# Patient Record
Sex: Female | Born: 1980 | Marital: Single | Smoking: Never smoker
Health system: Southern US, Community
[De-identification: ages and names within clinical notes are randomized; demographics above are authoritative.]

## PROBLEM LIST (undated history)

## (undated) HISTORY — PX: CHOLECYSTECTOMY: SHX55

---

## 2006-04-08 ENCOUNTER — Ambulatory Visit: Payer: Self-pay | Admitting: Family Medicine

## 2006-04-08 ENCOUNTER — Ambulatory Visit: Payer: Self-pay | Admitting: *Deleted

## 2007-07-27 DIAGNOSIS — B353 Tinea pedis: Secondary | ICD-10-CM

## 2007-07-27 DIAGNOSIS — F17200 Nicotine dependence, unspecified, uncomplicated: Secondary | ICD-10-CM

## 2007-07-27 DIAGNOSIS — H01009 Unspecified blepharitis unspecified eye, unspecified eyelid: Secondary | ICD-10-CM | POA: Insufficient documentation

## 2019-03-25 ENCOUNTER — Encounter (HOSPITAL_BASED_OUTPATIENT_CLINIC_OR_DEPARTMENT_OTHER): Payer: Self-pay | Admitting: Emergency Medicine

## 2019-03-25 ENCOUNTER — Emergency Department (HOSPITAL_BASED_OUTPATIENT_CLINIC_OR_DEPARTMENT_OTHER): Payer: Medicaid Other

## 2019-03-25 ENCOUNTER — Emergency Department (HOSPITAL_BASED_OUTPATIENT_CLINIC_OR_DEPARTMENT_OTHER)
Admission: EM | Admit: 2019-03-25 | Discharge: 2019-03-25 | Disposition: A | Discharge status: Home / Self Care | Payer: Medicaid Other | Attending: Emergency Medicine | Admitting: Emergency Medicine

## 2019-03-25 ENCOUNTER — Other Ambulatory Visit: Payer: Self-pay

## 2019-03-25 DIAGNOSIS — M545 Low back pain, unspecified: Secondary | ICD-10-CM

## 2019-03-25 LAB — URINALYSIS, ROUTINE W REFLEX MICROSCOPIC
Bilirubin Urine: NEGATIVE
Glucose, UA: NEGATIVE mg/dL
Hgb urine dipstick: NEGATIVE
Ketones, ur: NEGATIVE mg/dL
Nitrite: NEGATIVE
Protein, ur: NEGATIVE mg/dL
Specific Gravity, Urine: >1.03 — ABNORMAL HIGH (ref 1.005–1.030)
pH: 6 (ref 5.0–8.0)

## 2019-03-25 LAB — URINALYSIS, MICROSCOPIC (REFLEX): RBC / HPF: NONE SEEN RBC/hpf (ref 0–5)

## 2019-03-25 LAB — PREGNANCY, URINE: Preg Test, Ur: NEGATIVE

## 2019-03-25 MED ORDER — KETOROLAC TROMETHAMINE 30 MG/ML IJ SOLN
30.0000 mg | Freq: Once | INTRAMUSCULAR | Status: AC
  Administered 2019-03-25: 30 mg via INTRAMUSCULAR
  Filled 2019-03-25: qty 1

## 2019-03-25 MED ORDER — METHOCARBAMOL 750 MG PO TABS: 750.0000 mg | ORAL_TABLET | Freq: Two times a day (BID) | ORAL | 0 refills | Status: AC

## 2019-03-25 MED ORDER — LIDOCAINE 5 % EX PTCH: 1.0000 | MEDICATED_PATCH | CUTANEOUS | 0 refills | Status: AC

## 2019-03-25 MED ORDER — MELOXICAM 7.5 MG PO TABS: 7.5000 mg | ORAL_TABLET | Freq: Every day | ORAL | 0 refills | Status: AC

## 2019-03-25 NOTE — Discharge Instructions (Signed)
Take Mobic as prescribed once daily.  You can alternate with Tylenol throughout the day, but do not combine Mobic with other NSAID medication such as ibuprofen, Advil, Aleve, Naprosyn.  Take Robaxin twice daily as needed for muscle pain or spasms.  Do not drive or operate machinery while taking this medication.  You can apply Lidoderm patches to the affected area and change every 12 hours.  Use ice and heat alternating 20 minutes on, 20 minutes off.  Attempt the exercises and stretches daily as tolerated after heating and ice afterwards.  It is important that you ice, heat, and stretch.  Please follow-up with your doctor for further evaluation and treatment of your ongoing back pain.  Please return to the emergency department if you develop any new or worsening symptoms including complete numbness of your groin or legs, loss of bowel or bladder control, or any other new or concerning symptoms.

## 2019-03-25 NOTE — ED Triage Notes (Signed)
States," My back is out and I had to leave work early"  Denies recent injury

## 2019-03-25 NOTE — ED Provider Notes (Signed)
[facility] EMERGENCY DEPARTMENT Provider Note   CSN: [id number] Arrival date & time: 03/25/19  1021    History   Chief Complaint Chief Complaint  Patient presents with  . Back Pain    HPI Alicia Browning is a 38 y.o. female who is previously healthy who presents with low back pain that has been present for the past several weeks, however got worse today.  Patient reports it is worse with movement and occasionally radiates down her left leg.  She denies any numbness or tingling, saddle anesthesia, history of procedure or injection to back, history of IVDU or cancer, or fevers.  She has taken Tylenol and an unknown muscle relaxer without significant relief.  Patient denies any specific injury.  Patient reports she slept all day yesterday and did not do anything to hurt her back.  She denies any chest pain, shortness of breath, abdominal pain, nausea vomiting, urinary symptoms.  Per chart review, patient has been seen at urgent care for similar pain and given Mobic and Flexeril.     HPI  History reviewed. No pertinent past medical history.  Patient Active Problem List   Diagnosis Date Noted  . TINEA PEDIS 07/27/2007  . DISORDER, TOBACCO USE 07/27/2007  . 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 CHOLECYSTECTOMY       OB History   No obstetric history on file.      Home Medications    Prior to Admission medications   Medication Sig Start Date End Date Taking? Authorizing Provider  lidocaine (LIDODERM) 5 % Place 1 patch onto the skin daily. Remove & Discard patch within 12 hours or as directed by MD 03/25/19   Conni ElliotLaw, Waylan BogaAlexandra M, PA-C  meloxicam (MOBIC) 7.5 MG tablet Take 1 tablet (7.5 mg total) by mouth daily. 03/25/19   Bruk Tumolo, Waylan BogaAlexandra M, PA-C  methocarbamol (ROBAXIN) 750 MG tablet Take 1 tablet (750 mg total) by mouth 2 (two) times daily. 03/25/19   Emi HolesLaw, Annabelle Rexroad M, PA-C    Family History No family history on file.   Social History Social History   Tobacco Use  . Smoking status: Never Smoker  . Smokeless tobacco: Never Used  Substance Use Topics  . Alcohol use: Never    Frequency: Never  . Drug use: Never     Allergies   Patient has no known allergies.   Review of Systems Review of Systems  Constitutional: Negative for chills and fever.  HENT: Negative for facial swelling and sore throat.   Respiratory: Negative for shortness of breath.   Cardiovascular: Negative for chest pain.  Gastrointestinal: Negative for abdominal pain, nausea and vomiting.  Genitourinary: Negative for dysuria and frequency.  Musculoskeletal: Positive for back pain and myalgias.  Skin: Negative for rash and wound.  Neurological: Negative for headaches.  Psychiatric/Behavioral: The patient is not nervous/anxious.      Physical Exam Updated Vital Signs BP 135/89 (BP Location: Right Arm)   Pulse 78   Temp 98.3 F (36.8 C) (Oral)   Resp 17   Wt 113.4 kg   LMP 02/04/2019   SpO2 99%   Physical Exam Vitals signs and nursing note reviewed.  Constitutional:      General: She is not in acute distress.    Appearance: She is well-developed. She is not diaphoretic.  HENT:     Head: Normocephalic and atraumatic.     Mouth/Throat:     Pharynx: No oropharyngeal exudate.  Eyes:  General: No scleral icterus.       Right eye: No discharge.        Left eye: No discharge.     Conjunctiva/sclera: Conjunctivae normal.     Pupils: Pupils are equal, round, and reactive to light.  Neck:     Musculoskeletal: Normal range of motion and neck supple.     Thyroid: No thyromegaly.  Cardiovascular:     Rate and Rhythm: Normal rate and regular rhythm.     Heart sounds: Normal heart sounds. No murmur. No friction rub. No gallop.   Pulmonary:     Effort: Pulmonary effort is normal. No respiratory distress.     Breath sounds: Normal breath sounds. No stridor. No wheezing or rales.  Abdominal:     General: Bowel sounds are  normal. There is no distension.     Palpations: Abdomen is soft.     Tenderness: There is no abdominal tenderness. There is no guarding or rebound.  Musculoskeletal:     Lumbar back: She exhibits tenderness and bony tenderness.       Back:  Lymphadenopathy:     Cervical: No cervical adenopathy.  Skin:    General: Skin is warm and dry.     Coloration: Skin is not pale.     Findings: No rash.  Neurological:     Mental Status: She is alert.     Coordination: Coordination normal.     Comments: Normal sensation throughout; 5/5 strength in all 4 extremities       ED Treatments / Results  Labs (all labs ordered are listed, but only abnormal results are displayed) Labs Reviewed  URINALYSIS, ROUTINE W REFLEX MICROSCOPIC - Abnormal; Notable for the following components:      Result Value   APPearance HAZY (*)    Specific Gravity, Urine >1.030 (*)    Leukocytes,Ua TRACE (*)    All other components within normal limits  URINALYSIS, MICROSCOPIC (REFLEX) - Abnormal; Notable for the following components:   Bacteria, UA RARE (*)    All other components within normal limits  PREGNANCY, URINE    EKG None  Radiology Dg Lumbar Spine Complete  Result Date: 03/25/2019 CLINICAL DATA:  Lumbago EXAM: LUMBAR SPINE - COMPLETE 4+ VIEW COMPARISON:  None. FINDINGS: Frontal, lateral, spot lumbosacral lateral, and bilateral oblique views were obtained. There are 5 non-rib-bearing lumbar type vertebral bodies. 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 There is no fracture or spondylolisthesis. There is mild disc space narrowing at L4-5 and L5-S1. Other disc spaces appear unremarkable. There is no appreciable facet arthropathy. IMPRESSION: Mild disc space narrowing at L4-5 and L5-S1. No appreciable facet arthropathy. No fracture or spondylolisthesis. Electronically Signed   By: Lowella Grip III M.D.   On: 03/25/2019 14:13    Procedures Procedures (including critical care time)  Medications Ordered in ED  Medications  ketorolac (TORADOL) 30 MG/ML injection 30 mg (30 mg Intramuscular Given 03/25/19 1331)     Initial Impression / Assessment and Plan / ED Course  I have reviewed the triage vital signs and the nursing notes.  Pertinent labs & imaging results that were available during my care of the patient were reviewed by me and considered in my medical decision making (see chart for details).        Patient with back pain.  No neurological deficits and normal neuro exam.  Patient is ambulatory.  No loss of bowel or bladder control.  No concern for cauda equina.  No fever, night sweats, weight loss, h/o  cancer, IVDA, no recent procedure to back. No urinary symptoms suggestive of UTI.  UA is negative for infection.  X-ray lumbar spine shows mild disc space narrowing in L4-5 and L5-S1 without appreciable facet arthropathy, fracture, or spondylolisthesis.  Patient given IM Toradol in the ED.  Will discharge home with Mobic, Robaxin, Lidoderm patches.  Ice, heat, stretching discussed as well as return precaution.  Follow-up to PCP as needed for recurrent back pain.  Patient understands and agrees with plan.  Patient vital stable throughout ED course and discharged in satisfactory condition.   Final Clinical Impressions(s) / ED Diagnoses   Final diagnoses:  Acute bilateral low back pain, unspecified whether sciatica present    ED Discharge Orders         Ordered    meloxicam (MOBIC) 7.5 MG tablet  Daily     03/25/19 1448    methocarbamol (ROBAXIN) 750 MG tablet  2 times daily     03/25/19 1448    lidocaine (LIDODERM) 5 %  Every 24 hours     03/25/19 1448           Emi HolesLaw, Hatsuko Bizzarro M, PA-C 03/25/19 1726    Little, Ambrose Finlandachel Morgan, MD 03/26/19 [phone]

## 2019-03-25 NOTE — ED Notes (Signed)
Pt denies urinary symptoms, fevers,

## 2019-03-25 NOTE — ED Notes (Signed)
ED Provider at bedside. 

## 2020-09-21 IMAGING — DX LUMBAR SPINE - COMPLETE 4+ VIEW
5 series · 5 of 5 positions shown · non-contrast
Comparison: None.

CLINICAL DATA: Lumbago

EXAM:
LUMBAR SPINE - COMPLETE 4+ VIEW

[l-spine ap]
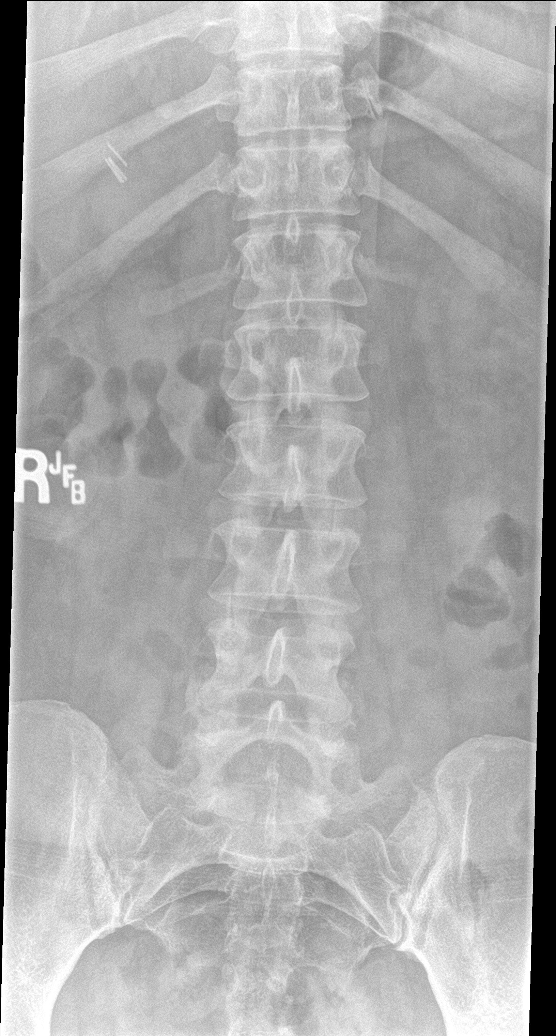

[l-spine obl (1 of 2)]
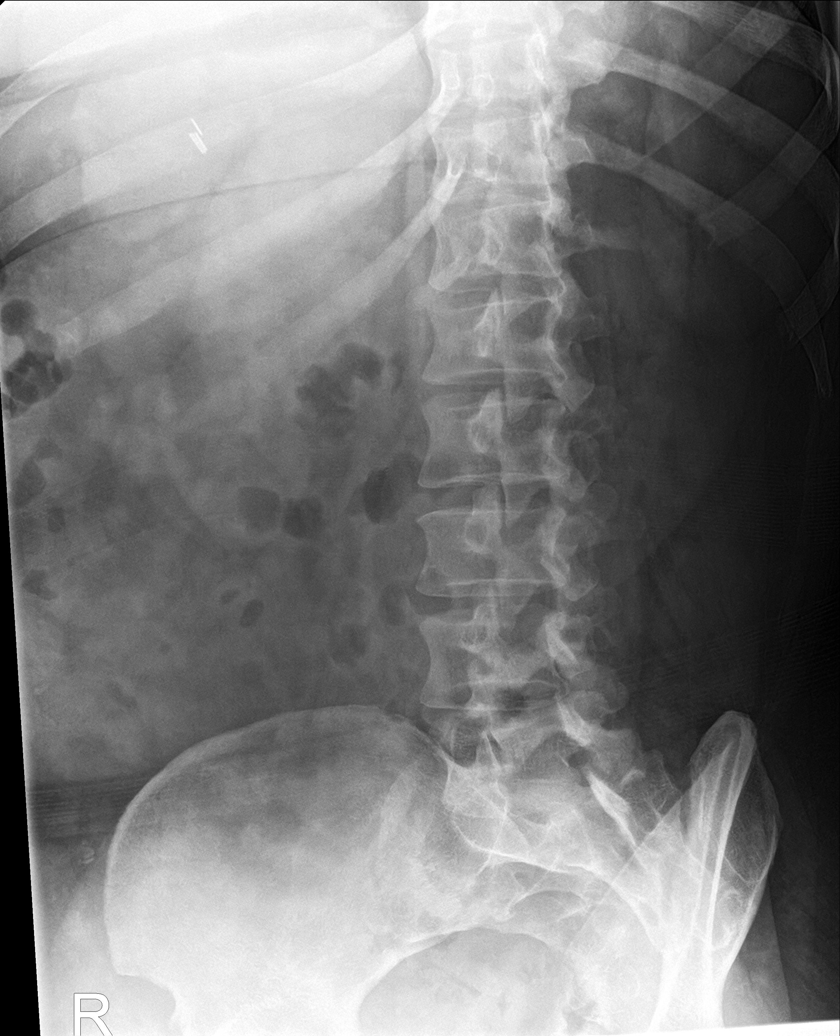

[l-spine obl (2 of 2)]
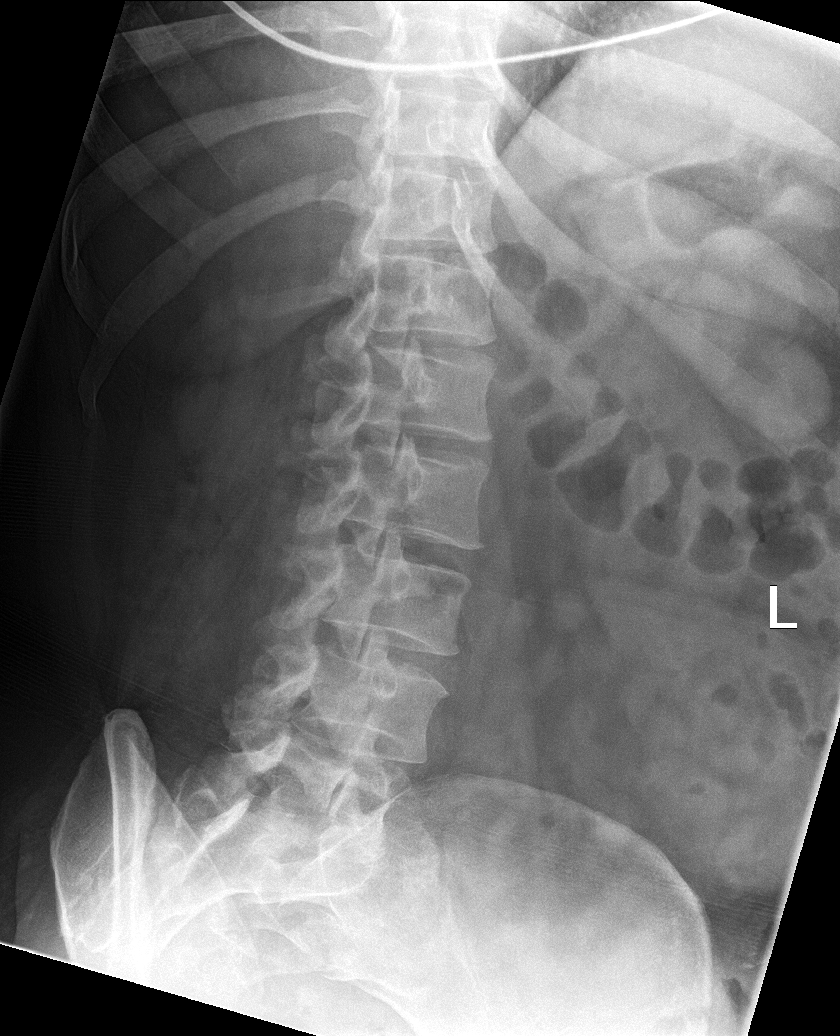

[l-spine lat]
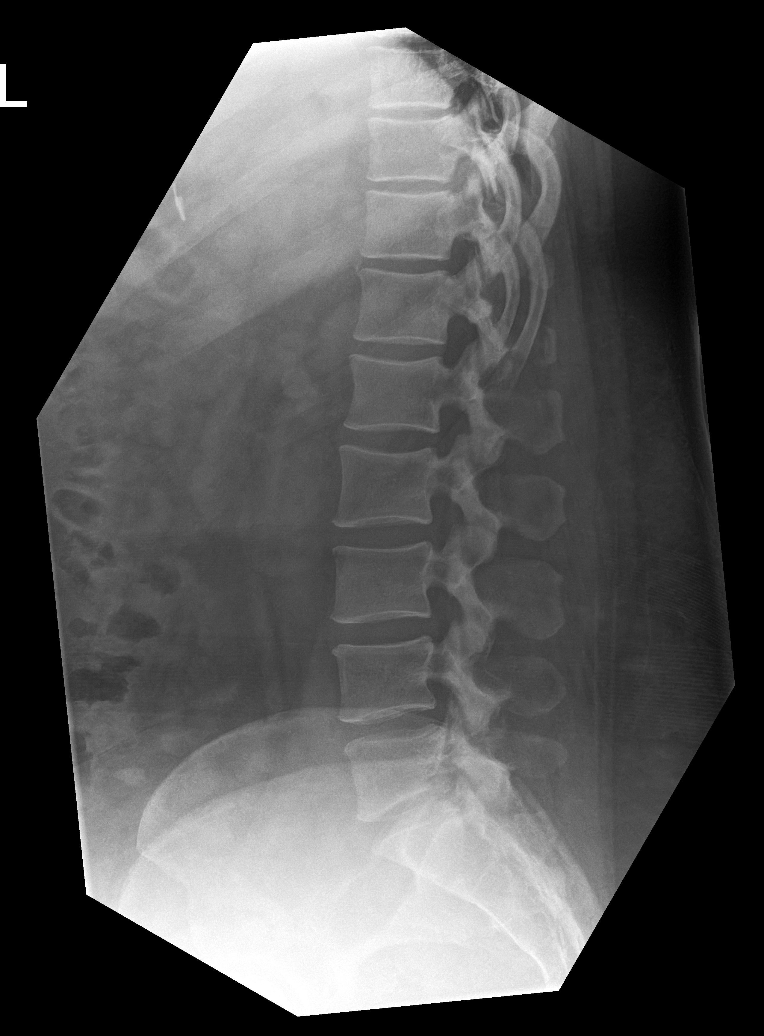

[l-spine spot]
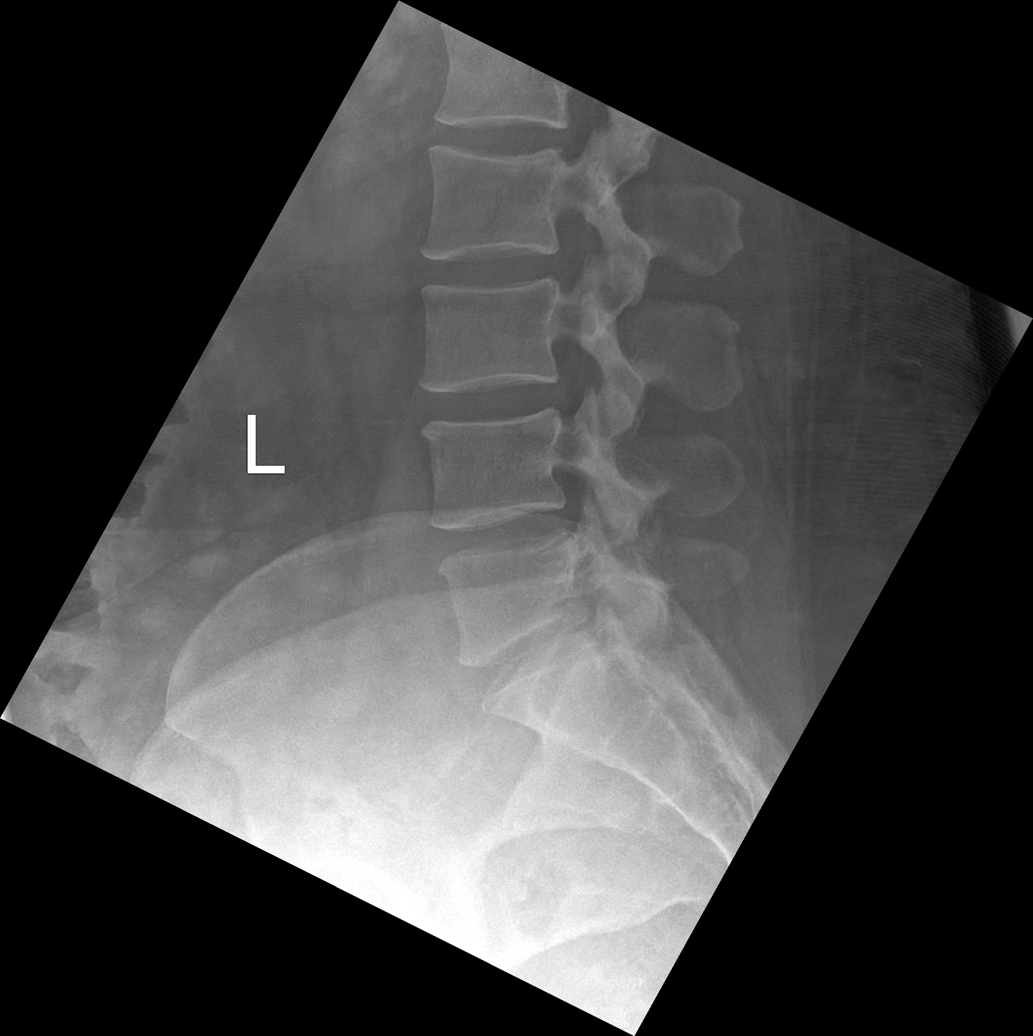

[5 of 5 positions shown; findings below may reference images not displayed]

FINDINGS: Frontal, lateral, spot lumbosacral lateral, and bilateral oblique
views were obtained. There are 5 non-rib-bearing lumbar type
vertebral bodies. T12 ribs are hypoplastic. There is no fracture or
spondylolisthesis. There is mild disc space narrowing at L4-5 and
L5-S1. Other disc spaces appear unremarkable. There is no
appreciable facet arthropathy.
IMPRESSION: Mild disc space narrowing at L4-5 and L5-S1. No appreciable facet
arthropathy. No fracture or spondylolisthesis.
